# Patient Record
Sex: Male | Born: 1965 | Race: Black or African American | Hispanic: No | Marital: Married | Smoking: Current every day smoker
Health system: Southern US, Community
[De-identification: ages and names within clinical notes are randomized; demographics above are authoritative.]

## PROBLEM LIST (undated history)

## (undated) DIAGNOSIS — C801 Malignant (primary) neoplasm, unspecified: Secondary | ICD-10-CM

## (undated) HISTORY — PX: PORT-A-CATH REMOVAL: SHX5289

## (undated) HISTORY — PX: TESTICLE REMOVAL: SHX68

## (undated) HISTORY — PX: ACHILLES TENDON REPAIR: SUR1153

---

## 1999-12-16 ENCOUNTER — Encounter: Payer: Self-pay | Admitting: Neurosurgery

## 1999-12-16 ENCOUNTER — Ambulatory Visit (HOSPITAL_COMMUNITY): Admission: RE | Admit: 1999-12-16 | Discharge: 1999-12-16 | Payer: Self-pay | Admitting: Neurosurgery

## 1999-12-30 ENCOUNTER — Encounter: Payer: Self-pay | Admitting: Neurosurgery

## 1999-12-30 ENCOUNTER — Ambulatory Visit (HOSPITAL_COMMUNITY): Admission: RE | Admit: 1999-12-30 | Discharge: 1999-12-30 | Payer: Self-pay | Admitting: Neurosurgery

## 2000-01-16 ENCOUNTER — Encounter: Payer: Self-pay | Admitting: Neurosurgery

## 2000-01-16 ENCOUNTER — Ambulatory Visit (HOSPITAL_COMMUNITY): Admission: RE | Admit: 2000-01-16 | Discharge: 2000-01-16 | Payer: Self-pay | Admitting: Neurosurgery

## 2000-07-17 ENCOUNTER — Encounter: Payer: Self-pay | Admitting: Neurosurgery

## 2000-07-17 ENCOUNTER — Ambulatory Visit (HOSPITAL_COMMUNITY): Admission: RE | Admit: 2000-07-17 | Discharge: 2000-07-17 | Payer: Self-pay | Admitting: Neurosurgery

## 2000-07-27 ENCOUNTER — Encounter: Payer: Self-pay | Admitting: Neurosurgery

## 2000-07-31 ENCOUNTER — Encounter (HOSPITAL_COMMUNITY): Admission: RE | Admit: 2000-07-31 | Discharge: 2000-08-30 | Payer: Self-pay | Admitting: Oncology

## 2000-07-31 ENCOUNTER — Encounter: Admission: RE | Admit: 2000-07-31 | Discharge: 2000-07-31 | Payer: Self-pay | Admitting: Oncology

## 2000-08-01 ENCOUNTER — Encounter: Payer: Self-pay | Admitting: Neurosurgery

## 2000-08-01 ENCOUNTER — Inpatient Hospital Stay (HOSPITAL_COMMUNITY): Admission: RE | Admit: 2000-08-01 | Discharge: 2000-08-02 | Payer: Self-pay | Admitting: Neurosurgery

## 2001-05-17 ENCOUNTER — Encounter (HOSPITAL_COMMUNITY): Admission: RE | Admit: 2001-05-17 | Discharge: 2001-06-16 | Payer: Self-pay | Admitting: Oncology

## 2001-11-17 ENCOUNTER — Emergency Department (HOSPITAL_COMMUNITY): Admission: EM | Admit: 2001-11-17 | Discharge: 2001-11-17 | Payer: Self-pay | Admitting: Emergency Medicine

## 2001-12-16 ENCOUNTER — Encounter (HOSPITAL_COMMUNITY): Admission: RE | Admit: 2001-12-16 | Discharge: 2002-01-15 | Payer: Self-pay | Admitting: Oncology

## 2001-12-16 ENCOUNTER — Encounter: Admission: RE | Admit: 2001-12-16 | Discharge: 2001-12-16 | Payer: Self-pay | Admitting: Oncology

## 2001-12-16 ENCOUNTER — Encounter (HOSPITAL_COMMUNITY): Payer: Self-pay | Admitting: Oncology

## 2002-04-10 ENCOUNTER — Emergency Department (HOSPITAL_COMMUNITY): Admission: EM | Admit: 2002-04-10 | Discharge: 2002-04-10 | Payer: Self-pay | Admitting: Emergency Medicine

## 2002-04-10 ENCOUNTER — Encounter: Payer: Self-pay | Admitting: Emergency Medicine

## 2002-06-13 ENCOUNTER — Encounter: Admission: RE | Admit: 2002-06-13 | Discharge: 2002-06-13 | Payer: Self-pay | Admitting: Oncology

## 2002-06-13 ENCOUNTER — Encounter (HOSPITAL_COMMUNITY): Payer: Self-pay | Admitting: Oncology

## 2002-06-13 ENCOUNTER — Encounter (HOSPITAL_COMMUNITY): Admission: RE | Admit: 2002-06-13 | Discharge: 2002-07-13 | Payer: Self-pay | Admitting: Oncology

## 2003-01-23 ENCOUNTER — Encounter: Admission: RE | Admit: 2003-01-23 | Discharge: 2003-01-23 | Payer: Self-pay | Admitting: Oncology

## 2003-01-23 ENCOUNTER — Encounter (HOSPITAL_COMMUNITY): Admission: RE | Admit: 2003-01-23 | Discharge: 2003-02-22 | Payer: Self-pay | Admitting: Oncology

## 2003-08-05 ENCOUNTER — Encounter: Admission: RE | Admit: 2003-08-05 | Discharge: 2003-08-05 | Payer: Self-pay | Admitting: Oncology

## 2003-08-05 ENCOUNTER — Encounter (HOSPITAL_COMMUNITY): Admission: RE | Admit: 2003-08-05 | Discharge: 2003-09-04 | Payer: Self-pay | Admitting: Oncology

## 2004-04-13 ENCOUNTER — Ambulatory Visit: Payer: Self-pay | Admitting: Family Medicine

## 2006-07-01 ENCOUNTER — Emergency Department (HOSPITAL_COMMUNITY): Admission: EM | Admit: 2006-07-01 | Discharge: 2006-07-02 | Payer: Self-pay | Admitting: Emergency Medicine

## 2007-02-21 ENCOUNTER — Encounter: Payer: Self-pay | Admitting: Family Medicine

## 2007-06-12 ENCOUNTER — Emergency Department (HOSPITAL_COMMUNITY): Admission: EM | Admit: 2007-06-12 | Discharge: 2007-06-12 | Payer: Self-pay | Admitting: Emergency Medicine

## 2007-06-13 ENCOUNTER — Ambulatory Visit: Payer: Self-pay | Admitting: Orthopedic Surgery

## 2007-06-13 DIAGNOSIS — M66369 Spontaneous rupture of flexor tendons, unspecified lower leg: Secondary | ICD-10-CM

## 2007-06-14 ENCOUNTER — Ambulatory Visit (HOSPITAL_COMMUNITY): Admission: RE | Admit: 2007-06-14 | Discharge: 2007-06-14 | Payer: Self-pay | Admitting: Orthopedic Surgery

## 2007-06-14 ENCOUNTER — Ambulatory Visit: Payer: Self-pay | Admitting: Orthopedic Surgery

## 2007-06-17 ENCOUNTER — Ambulatory Visit: Payer: Self-pay | Admitting: Orthopedic Surgery

## 2007-06-18 ENCOUNTER — Encounter: Payer: Self-pay | Admitting: Orthopedic Surgery

## 2007-06-24 ENCOUNTER — Ambulatory Visit: Payer: Self-pay | Admitting: Orthopedic Surgery

## 2007-07-09 ENCOUNTER — Ambulatory Visit: Payer: Self-pay | Admitting: Orthopedic Surgery

## 2007-07-31 ENCOUNTER — Ambulatory Visit: Payer: Self-pay | Admitting: Orthopedic Surgery

## 2007-09-19 ENCOUNTER — Telehealth: Payer: Self-pay | Admitting: Orthopedic Surgery

## 2007-10-03 ENCOUNTER — Encounter: Payer: Self-pay | Admitting: Orthopedic Surgery

## 2010-03-22 NOTE — Letter (Signed)
Summary: Historic Patient File  Historic Patient File   Imported By: Lind Guest 01/11/2010 09:12:46  _____________________________________________________________________  External Attachment:    Type:   Image     Comment:   External Document

## 2010-03-22 NOTE — Progress Notes (Signed)
Summary: Fol up call made re: No Show for appt  Phone Note Outgoing Call   Call placed to: Patient Summary of Call: per Dr Romeo Apple, called patient / lft message on ans machine @[facility]  [phone] (2nd contact ph#nonworking) to fol up d/t his no show for appointment Mon, 09/16/07.  [facility] spoke w/patient Fri 09/13/07 & confirmed appt. Initial call taken by: Cammie Sickle,  September 19, 2007 5:23 PM  Follow-up for Phone Call        Left another msg on pt's voice mail to fol up as no return call as of yet *LETTER? Follow-up by: Cammie Sickle,  October 02, 2007 9:52 AM

## 2010-07-05 NOTE — Op Note (Signed)
[name], [name]             ACCOUNT NO.:  [id number]   MEDICAL RECORD NO.:  [id number]          PATIENT TYPE:  AMB   LOCATION:  DAY                           FACILITY:  [facility]   PHYSICIAN:  Vickki Hearing, M.D.DATE OF BIRTH:  December 28, 1965   DATE OF PROCEDURE:  06/14/2007  DATE OF DISCHARGE:                               OPERATIVE REPORT   HISTORY:  This is a 45 year old male with a history of testicular  cancer, treated successfully with right oophorectomy and chemotherapy  who presented with a right Achilles tendon rupture, injured on June 12, 2007.  He simply bent over to pick up a basketball and the tendon  popped.  This happened to him several years back, and he ruptured the  left Achilles, and we repaired it successfully.   PREOPERATIVE DIAGNOSIS:  Achilles tendon rupture, right ankle.   POSTOPERATIVE DIAGNOSIS:  Achilles tendon rupture, right ankle.   PROCEDURE:  Open repair of right Achilles tendon.   SURGEON:  Vickki Hearing, MD.   ASSISTED BY:   [place].   ANESTHESIA:  General anesthetic with the tube.   FINDINGS:  Complete rupture of the Achilles tendon in the characteristic  watershed area.  There were no specimens.  Findings as stated.   BLOOD LOSS:  Minimal.   COMPLICATIONS:  None.   COUNTS:  Correct.   CONDITION:  Patient to PACU in good condition.   DETAILS OF PROCEDURE:  Patrick Zavala was evaluated preoperatively in  the holding area.  Appropriate site marking was performed, and the chart  was updated.   He was given Ancef, taken to surgery where he had general anesthesia  with intubation smoothly and without complication.  He was then placed  prone on the operating table with padding, and his right lower extremity  was prepped and draped using sterile technique.  Tourniquet applied to  the thigh.  After proper time-out procedure, the limb was exsanguinated  with a 4-inch Esmarch, tourniquet elevated 300 mmHg where it stayed for  41 minutes.  Slightly, medial incision was made over the Achilles tendon  and extended proximally and distally from the site of the rupture.  Subcutaneous tissue divided, tendon sheath was split, ruptured tendon  was freshened, debrided, and then #5 Tycron suture was passed in a  baseball stitch fashion from the distal portion proximally, and then  from the proximal portion distally.  The medial and lateral two ends of  each suture were tied with the foot in plantar flexion.  This secured  the tendon repair and reapposed the two edges.  We then irrigated the  wound and closed with 2-0 Monocryl and staples.  We injected 20 mL of  Marcaine with epinephrine around the skin edges, tourniquet was  released, and sterile bandage was applied.  Foot was placed in a  posterior splint with the foot at neutral position.  The patient  extubated and taken to recovery room in stable condition.   POSTOPERATIVE PLAN:  Six weeks nonweightbearing, then 6 weeks  weightbearing and brace.  At 6 weeks postop, the patient can start  strengthening exercises.  From week 0 through 6, the patient can do  plantar flexion exercises and dorsiflexion exercises.  The patient will  be discharged home on Lorcet Plus and Phenergan.      Vickki Hearing, M.D.  Electronically Signed     SEH/MEDQ  D:  06/14/2007  T:  06/15/2007  Job:  [id number]

## 2010-07-05 NOTE — H&P (Signed)
NAMELAQUINCY, EASTRIDGE             ACCOUNT NO.:  [id number]   MEDICAL RECORD NO.:  [id number]          PATIENT TYPE:  AMB   LOCATION:  DAY                           FACILITY:  [facility]   PHYSICIAN:  Vickki Hearing, M.D.DATE OF BIRTH:  12/29/1965   DATE OF ADMISSION:  DATE OF DISCHARGE:  LH                              HISTORY & PHYSICAL   CHIEF COMPLAINT:  Torn right Achilles tendon.   HISTORY:  A 45 year old male with a history of testicular cancer,  treated successfully with right oophorectomy and chemotherapy, presents  status post left Achilles tendon repair now with a right Achilles tendon  repair.  He was injured on May 23, 2007.  He simply bent over to pick  up a ball and the tendon popped.  He heard a loud sound, pain, burning  in his right Achilles.  This was similar to his other injury for which  he underwent repair successfully.  He presents for surgical treatment of  the right Achilles tear.   He has no other medical problems.   No family history, except for a family history of cancer.   He is married.  He does smoke, drinks occasionally.  He has a high  school diploma.   REVIEW OF SYSTEMS:  Only positive for testicular cancer.   He has also had lumbar disk surgery.  He had the Achilles surgery as  stated.  He had an oophorectomy as stated.   PHYSICAL EXAMINATION:  VITAL SIGNS:  Stable.  Weight is 161 pounds.  APPEARANCE:  Normal.  CARDIOVASCULAR:  Intact.  SKIN:  Warm and dry with no rashes.  LYMPH NODES:  Normal.  PSYCH:  Normal.  He is awake, alert, and oriented x3.  NEUROLOGIC:  Normal.  MUSCULOSKELETAL:  Findings were a positive Thompson test for rupture of  the right Achilles tendon.  There is swelling there with palpable gap  and tenderness.  Passive range of motion is painful as well.  His other  extremities on inspection are normal.  There are no contractures.  No  instability.  Normal muscle tone is noted.   IMPRESSION:  Right Achilles tendon  rupture.   PLAN:  Right Achilles tendon repair.      Vickki Hearing, M.D.  Electronically Signed     SEH/MEDQ  D:  06/13/2007  T:  06/13/2007  Job:  [id number]   cc:   [facility]  Fax: [phone]

## 2010-07-08 NOTE — Op Note (Signed)
. [facility]  Patient:    Patrick Zavala, Patrick Zavala                    MRN: [id number] Proc. Date: 08/01/00 Adm. 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  PROCEDURE:  Lumbar laminectomy and microdiskectomy at L4-5 using microdissection of the right L5 nerve root and microscopic diskectomy.  SURGEON:  Donzetta Sprung. Roney Jaffe., MD  ASSISTANT:  Reinaldo Meeker, M.D.  ANESTHESIA:  General endotracheal anesthesia.  ESTIMATED BLOOD LOSS:  Less than 100 cc.  INDICATIONS:  The patient is a very pleasant 45 year old gentleman who has had longstanding back and right leg pain radiating down to the top of his foot and his big toe for the last year or so.  He has failed anti-inflammatories, physical therapy, epidural steroid injections, and has had progressive worsening of his pain.  The patients preoperative imaging showed a ruptured disk compressing his right L5 nerve root, counseled on the risks, benefits of surgery.  The patient understands these.  DESCRIPTION OF PROCEDURE:  The patient was brought to the OR, induced under general anesthesia, positioned prone on the Wilson frame.  His back was prepped and draped in the usual sterile fashion.  After infiltration with 10 cc of lidocaine with epinephrine, a midline incision was made with a 10 blade scalpel after localization with an x-ray confirming L4-5 interspace.  Bovie electrocautery was done.  Subperiosteal dissection was carried out along the lamina of L4 and L5 on the right side.  Intraoperative x-ray confirmed localization of the L4-5 interspace.  Then using a 3 and 4 mm Kerrison punch, the inferior aspect of the L4 lamina was removed, and the medial aspect of this complex using highspeed drill, part of the medial aspect of the  facet complex was also thinned out.  The remainder of the laminotomy was continued with a 3 mm Kerrison punch.  Thecal sac was visualized.  Ligamentum flavum was removed in piecemeal fashion exposing the proximal aspect of the L5 nerve root. The operating microscope was draped, brought into the field, and under microscopic illumination, the remainder of the L5 nerve root was dissected free.  Then a 4 punch was used to palpate the interspace and reflect the thecal sac medially.  There was noted to be a bulbous piece of disk laterally compressing the proximal aspect of the L5 nerve root.  The DEricko nerve root resector was used to hold the L5 nerve root medially.  The annulotomy was made with 11 blade scalpel and a lot of degenerated disk material was removed. There was a bulbous fragment both centrally and laterally that was removed and the pituitary rongeurs used to clean out the remainder of the interspace. After the interspace had been completely cleaned out, the nerve was explored with a hockey stick and noted to be completely decompressed.  The wound was copiously irrigated and hemostasis was maintained.  Gelfoam was overlaid on top of the dura.  The fascia was closed with 0 interrupted Vicryl, subcutaneous tissue was closed with 2-0 interrupted Vicryl, and the skin was closed with running 4-0 subcuticular.  Benzoin and Steri-Strips were applied. At the end of the case, all sponge,  needle, and instrument counts were correct. DD:  08/01/00 TD:  08/01/00 Job: [id number]

## 2010-08-27 ENCOUNTER — Emergency Department (HOSPITAL_COMMUNITY)
Admission: EM | Admit: 2010-08-27 | Discharge: 2010-08-27 | Disposition: A | Discharge status: Home / Self Care | Payer: Self-pay | Attending: Emergency Medicine | Admitting: Emergency Medicine

## 2010-08-27 ENCOUNTER — Encounter: Payer: Self-pay | Admitting: Emergency Medicine

## 2010-08-27 DIAGNOSIS — K0889 Other specified disorders of teeth and supporting structures: Secondary | ICD-10-CM

## 2010-08-27 DIAGNOSIS — F17200 Nicotine dependence, unspecified, uncomplicated: Secondary | ICD-10-CM | POA: Insufficient documentation

## 2010-08-27 DIAGNOSIS — K089 Disorder of teeth and supporting structures, unspecified: Secondary | ICD-10-CM | POA: Insufficient documentation

## 2010-08-27 HISTORY — DX: Malignant (primary) neoplasm, unspecified: C80.1

## 2010-08-27 MED ORDER — HYDROMORPHONE HCL 2 MG PO TABS: 2.0000 mg | ORAL_TABLET | ORAL | Status: AC | PRN

## 2010-08-27 MED ORDER — IBUPROFEN 800 MG PO TABS: 800.0000 mg | ORAL_TABLET | Freq: Three times a day (TID) | ORAL | Status: AC

## 2010-08-27 MED ORDER — PENICILLIN V POTASSIUM 500 MG PO TABS: 500.0000 mg | ORAL_TABLET | Freq: Four times a day (QID) | ORAL | Status: AC

## 2010-08-27 MED ORDER — IBUPROFEN 800 MG PO TABS
800.0000 mg | ORAL_TABLET | Freq: Once | ORAL | Status: AC
Start: 2010-08-27 — End: 2010-08-27
  Administered 2010-08-27: 800 mg via ORAL
  Filled 2010-08-27: qty 1

## 2010-08-27 NOTE — ED Notes (Signed)
Right sided tooth ache x 2 days with increased swelling in right lower jaw.  Taking ibuprofen at home with no relief.  Unable to open mouth completely.  Minimal swelling noted to right lower jaw.  nad noted.

## 2010-08-27 NOTE — ED Provider Notes (Signed)
History     Chief Complaint  Patient presents with   Dental Pain    Pt c/o chipping tooth to r lower side.slight swelling noticed. happened yesterday. NAD   HPI Comments: Pt reports acute toothache onset last night s/p chipped tooth; has dentist appt on Monday but pain worse today.   Patient is a 45 y.o. male presenting with tooth pain. The history is provided by the patient.  Dental PainThe primary symptoms include mouth pain. Primary symptoms do not include dental injury, headaches, fever, shortness of breath, sore throat or cough. Primary symptoms comment: right last lower molar pain with swelling to right jaw The symptoms began 12 to 24 hours ago. The symptoms are worsening. The symptoms occur constantly.  Additional symptoms include: facial swelling. Additional symptoms do not include: trouble swallowing and ear pain.    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 No pertinent family history.  History  Substance Use Topics   Smoking status: Current Everyday Smoker -- 0.2 packs/day    Types: Cigarettes   Smokeless tobacco: Not on file   Alcohol Use: Yes     occasional      Review of Systems  Constitutional: Negative for fever and chills.  HENT: Positive for facial swelling and dental problem. Negative for ear pain, sore throat, trouble swallowing and neck pain.   Eyes: Negative for pain.  Respiratory: Negative for cough and shortness of breath.   Cardiovascular: Negative for chest pain.  Gastrointestinal: Negative for nausea, vomiting and abdominal pain.  Musculoskeletal: Negative for back pain.  Skin: Negative for rash.  Neurological: Negative for headaches.    Physical Exam  BP 145/104   Pulse 95   Temp(Src) 98.7 F (37.1 C) (Oral)   Resp 0   Ht 5\' 10"  (1.778 m)   Wt 155 lb (70.308 kg)   BMI  22.24 kg/m2   SpO2 99%  Physical Exam  Nursing note and vitals reviewed. Constitutional: He is oriented to person, place, and time. He appears well-developed and well-nourished.  HENT:  Head: Normocephalic and atraumatic.  Right Ear: External ear normal.  Left Ear: External ear normal.  Mouth/Throat: Oropharynx is clear and moist.       Right lower last molar erosion to the lingual aspect with tenderness; mild facial swelling;  Eyes: Conjunctivae and EOM are normal.  Neck: Normal range of motion. Neck supple.  Cardiovascular: Normal rate, regular rhythm and normal heart sounds.   Pulmonary/Chest: Effort normal and breath sounds normal. He has no wheezes. He exhibits no tenderness.  Abdominal: Soft. Bowel sounds are normal. There is no tenderness.  Musculoskeletal: Normal range of motion. He exhibits no edema and no tenderness.  Neurological: He is alert and oriented to person, place, and time. He has normal strength. No cranial nerve deficit or sensory deficit.  Skin: Skin is warm and dry. No rash noted.  Psychiatric: He has a normal mood and affect.    ED Course  Procedures Written by Enos Fling acting as scribe for Dr. Deretha Emory.  MDM

## 2010-11-15 LAB — HEMOGLOBIN AND HEMATOCRIT, BLOOD: HCT: 40.9

## 2013-07-16 ENCOUNTER — Emergency Department (HOSPITAL_COMMUNITY): Payer: BC Managed Care – PPO

## 2013-07-16 ENCOUNTER — Emergency Department (HOSPITAL_COMMUNITY)
Admission: EM | Admit: 2013-07-16 | Discharge: 2013-07-16 | Disposition: A | Discharge status: Home / Self Care | Payer: BC Managed Care – PPO | Attending: Emergency Medicine | Admitting: Emergency Medicine

## 2013-07-16 ENCOUNTER — Encounter (HOSPITAL_COMMUNITY): Payer: Self-pay | Admitting: Emergency Medicine

## 2013-07-16 DIAGNOSIS — Z8547 Personal history of malignant neoplasm of testis: Secondary | ICD-10-CM | POA: Insufficient documentation

## 2013-07-16 DIAGNOSIS — IMO0002 Reserved for concepts with insufficient information to code with codable children: Secondary | ICD-10-CM | POA: Insufficient documentation

## 2013-07-16 DIAGNOSIS — F17200 Nicotine dependence, unspecified, uncomplicated: Secondary | ICD-10-CM | POA: Insufficient documentation

## 2013-07-16 DIAGNOSIS — S86912A Strain of unspecified muscle(s) and tendon(s) at lower leg level, left leg, initial encounter: Secondary | ICD-10-CM

## 2013-07-16 DIAGNOSIS — X503XXA Overexertion from repetitive movements, initial encounter: Secondary | ICD-10-CM | POA: Insufficient documentation

## 2013-07-16 DIAGNOSIS — Y9389 Activity, other specified: Secondary | ICD-10-CM | POA: Insufficient documentation

## 2013-07-16 DIAGNOSIS — Y929 Unspecified place or not applicable: Secondary | ICD-10-CM | POA: Insufficient documentation

## 2013-07-16 DIAGNOSIS — X500XXA Overexertion from strenuous movement or load, initial encounter: Secondary | ICD-10-CM | POA: Insufficient documentation

## 2013-07-16 MED ORDER — TRAMADOL HCL 50 MG PO TABS: ORAL_TABLET | ORAL | Status: DC

## 2013-07-16 MED ORDER — KETOROLAC TROMETHAMINE 10 MG PO TABS
10.0000 mg | ORAL_TABLET | Freq: Once | ORAL | Status: AC
  Administered 2013-07-16: 10 mg via ORAL
  Filled 2013-07-16: qty 1

## 2013-07-16 MED ORDER — DICLOFENAC SODIUM 75 MG PO TBEC: 75.0000 mg | DELAYED_RELEASE_TABLET | Freq: Two times a day (BID) | ORAL | Status: DC

## 2013-07-16 NOTE — ED Provider Notes (Signed)
CSN: [id number]     Arrival date & time 07/16/13  7341 History   First MD Initiated Contact with Patient 07/16/13 1128     Chief Complaint  Patient presents with  . Knee Pain     (Consider location/radiation/quality/duration/timing/severity/associated sxs/prior Treatment) HPI Comments: Patient states he's had some problem with his left knee for some time. He was working on last evening, doing a lot of pulling and pushing at his job, and later notice pain in his knee at about 4:30 this morning. The patient states that the pain got progressively worse instead of better, and he could not find any way to relieve the pain. He became concerned for possible tear or other problem in his knee as he has had anterior cruciate ligament surgery in the past and he presents now for assistance.  Patient is a 48 y.o. male presenting with knee pain. The history is provided by the patient.  Knee Pain Associated symptoms: no back pain and no neck pain     Past Medical History  Diagnosis Date  . Cancer     testicular   Past Surgical History  Procedure Laterality Date  . Achilles tendon repair    . Port-a-cath removal    . Testicle removal      due to cancer, Right   No family history on file. History  Substance Use Topics  . Smoking status: Current Every Day Smoker -- 0.20 packs/day    Types: Cigarettes  . Smokeless tobacco: Not on file  . Alcohol Use: Yes     Comment: occasional    Review of Systems  Constitutional: Negative for activity change.       All ROS Neg except as noted in HPI  HENT: Negative for nosebleeds.   Eyes: Negative for photophobia and discharge.  Respiratory: Negative for cough, shortness of breath and wheezing.   Cardiovascular: Negative for chest pain and palpitations.  Gastrointestinal: Negative for abdominal pain and blood in stool.  Genitourinary: Negative for dysuria, frequency and hematuria.  Musculoskeletal: Positive for arthralgias. Negative for back pain and  neck pain.  Skin: Negative.   Neurological: Negative for dizziness, seizures and speech difficulty.  Psychiatric/Behavioral: Negative for hallucinations and confusion.      Allergies  Oxycodone-acetaminophen  Home Medications   Prior to Admission medications   Not on File   BP 115/78  Pulse 68  Temp(Src) 98.2 F (36.8 C) (Oral)  Resp 20  Ht 5\' 10"  (1.778 m)  Wt 155 lb (70.308 kg)  BMI 22.24 kg/m2  SpO2 98% Physical Exam  Nursing note and vitals reviewed. Constitutional: He is oriented to person, place, and time. He appears well-developed and well-nourished.  Non-toxic appearance.  HENT:  Head: Normocephalic.  Right Ear: Tympanic membrane and external ear normal.  Left Ear: Tympanic membrane and external ear normal.  Eyes: EOM and lids are normal. Pupils are equal, round, and reactive to light.  Neck: Normal range of motion. Neck supple. Carotid bruit is not present.  Cardiovascular: Normal rate, regular rhythm, normal heart sounds, intact distal pulses and normal pulses.   Pulmonary/Chest: Breath sounds normal. No respiratory distress.  Abdominal: Soft. Bowel sounds are normal. There is no tenderness. There is no guarding.  Musculoskeletal: Normal range of motion.  There is good range of motion of the left hip. There is crepitus and pain with range of motion of the left knee. The patella is in the midline. There is no joint effusion present. No posterior mass noted. There is  no deformity of the quadriceps area or the calf behind the knee. The dorsalis pedis pulses 2+. The Achilles tendon is intact.  Lymphadenopathy:       Head (right side): No submandibular adenopathy present.       Head (left side): No submandibular adenopathy present.    He has no cervical adenopathy.  Neurological: He is alert and oriented to person, place, and time. He has normal strength. No cranial nerve deficit or sensory deficit.  Skin: Skin is warm and dry.  Psychiatric: He has a normal mood and  affect. His speech is normal.    ED Course  Procedures (including critical care time) Labs Review Labs Reviewed - No data to display  Imaging Review Dg Knee Complete 4 Views Left  07/16/2013   CLINICAL DATA:  Lateral left knee pain  EXAM: LEFT KNEE - COMPLETE 4+ VIEW  COMPARISON:  None.  FINDINGS: There is no evidence of fracture, dislocation, or joint effusion. There is no evidence of arthropathy or other focal bone abnormality. Soft tissues are unremarkable.  IMPRESSION: Negative.   Electronically Signed   By: Lajean Manes M.D.   On: 07/16/2013 11:07     EKG Interpretation None      MDM X-ray of the left knee reveals some degenerative changes present, but no fracture, dislocation, or effusion. Suspect the patient has a strain of the left knee as well as some exacerbation of the degenerative changes.  Plan at this time is for the patient to receive a knee immobilizer. Crutches. Prescription for Toradol and diclofenac. Patient referred to orthopedics.    Final diagnoses:  None    *I have reviewed nursing notes, vital signs, and all appropriate lab and imaging results for this patient.Lenox Ahr, PA-C 07/16/13 1225

## 2013-07-16 NOTE — ED Notes (Addendum)
Pt reports pain in left lateral knee since 0430 this morning.  Denies injury that he is aware of.  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

## 2013-07-16 NOTE — Discharge Instructions (Signed)
Your x-ray is negative for fracture or dislocation. Please use the knee immobilizer for the next 7 days or until seen by the orthopedic MD. Use crutches until you can safely apply weight to the left leg. Please see Dr Luna Glasgow as soon as possible. Please use diclofenac and tramadol for discomfort. Knee Sprain A knee sprain is a tear in the strong bands of tissue that connect the bones (ligaments) of your knee. HOME CARE  Raise (elevate) your injured knee to lessen puffiness (swelling).  To ease pain and puffiness, put ice on the injured area.  Put ice in a plastic bag.  Place a towel between your skin and the bag.  Leave the ice on for 20 minutes, 2 3 times a day.  Only take medicine as told by your doctor.  Do not leave your knee unprotected until pain and stiffness go away (usually 4 6 weeks).  If you have a cast or splint, do not get it wet. If your doctor told you to not take it off, cover it with a plastic bag when you shower or bathe. Do not swim.  Your doctor may have you do exercises to prevent or limit permanent weakness and stiffness. GET HELP RIGHT AWAY IF:   Your cast or splint becomes damaged.  Your pain gets worse.  You have a lot of pain, puffiness, or numbness below the cast or splint. MAKE SURE YOU:   Understand these instructions.  Will watch your condition.  Will get help right away if you are not doing well or get worse. Document Released: 01/25/2009 Document Revised: 11/27/2012 Document Reviewed: 10/15/2012 [facility] Patient Information 2014 [place].

## 2013-07-16 NOTE — ED Provider Notes (Signed)
Medical screening examination/treatment/procedure(s) were performed by non-physician practitioner and as supervising physician I was immediately available for consultation/collaboration.   EKG Interpretation None        Kimbree Casanas L Daly Whipkey, MD 07/16/13 1521 

## 2013-12-11 ENCOUNTER — Encounter (HOSPITAL_COMMUNITY): Payer: Self-pay | Admitting: Emergency Medicine

## 2013-12-11 ENCOUNTER — Emergency Department (HOSPITAL_COMMUNITY)
Admission: EM | Admit: 2013-12-11 | Discharge: 2013-12-11 | Disposition: A | Discharge status: Home / Self Care | Payer: BC Managed Care – PPO | Attending: Emergency Medicine | Admitting: Emergency Medicine

## 2013-12-11 ENCOUNTER — Other Ambulatory Visit: Payer: Self-pay

## 2013-12-11 DIAGNOSIS — Z8547 Personal history of malignant neoplasm of testis: Secondary | ICD-10-CM | POA: Insufficient documentation

## 2013-12-11 DIAGNOSIS — Z720 Tobacco use: Secondary | ICD-10-CM | POA: Diagnosis not present

## 2013-12-11 DIAGNOSIS — F1410 Cocaine abuse, uncomplicated: Secondary | ICD-10-CM | POA: Insufficient documentation

## 2013-12-11 DIAGNOSIS — F1010 Alcohol abuse, uncomplicated: Secondary | ICD-10-CM

## 2013-12-11 LAB — CBC
HCT: 44.6 % (ref 39.0–52.0)
Hemoglobin: 16.4 g/dL (ref 13.0–17.0)
MCH: 28.6 pg (ref 26.0–34.0)
MCHC: 36.8 g/dL — ABNORMAL HIGH (ref 30.0–36.0)
MCV: 77.8 fL — ABNORMAL LOW (ref 78.0–100.0)
Platelets: 248 K/uL (ref 150–400)
RBC: 5.73 MIL/uL (ref 4.22–5.81)
RDW: 14.4 % (ref 11.5–15.5)
WBC: 7.6 K/uL (ref 4.0–10.5)

## 2013-12-11 LAB — COMPREHENSIVE METABOLIC PANEL WITH GFR
ALT: 23 U/L (ref 0–53)
AST: 28 U/L (ref 0–37)
Albumin: 4.3 g/dL (ref 3.5–5.2)
Alkaline Phosphatase: 79 U/L (ref 39–117)
Anion gap: 13 (ref 5–15)
BUN: 9 mg/dL (ref 6–23)
CO2: 25 meq/L (ref 19–32)
Calcium: 10 mg/dL (ref 8.4–10.5)
Chloride: 104 meq/L (ref 96–112)
Creatinine, Ser: 1.27 mg/dL (ref 0.50–1.35)
GFR calc Af Amer: 77 mL/min — ABNORMAL LOW
GFR calc non Af Amer: 67 mL/min — ABNORMAL LOW
Glucose, Bld: 113 mg/dL — ABNORMAL HIGH (ref 70–99)
Potassium: 4.6 meq/L (ref 3.7–5.3)
Sodium: 142 meq/L (ref 137–147)
Total Bilirubin: 0.7 mg/dL (ref 0.3–1.2)
Total Protein: 7.9 g/dL (ref 6.0–8.3)

## 2013-12-11 LAB — ACETAMINOPHEN LEVEL: Acetaminophen (Tylenol), Serum: <15 ug/mL (ref 10–30)

## 2013-12-11 LAB — ETHANOL: Alcohol, Ethyl (B): <11 mg/dL (ref 0–11)

## 2013-12-11 LAB — RAPID URINE DRUG SCREEN, HOSP PERFORMED
Amphetamines: NOT DETECTED
Barbiturates: NOT DETECTED
Benzodiazepines: NOT DETECTED
Cocaine: POSITIVE — AB
Opiates: NOT DETECTED
Tetrahydrocannabinol: NOT DETECTED

## 2013-12-11 LAB — SALICYLATE LEVEL: Salicylate Lvl: <2 mg/dL — ABNORMAL LOW (ref 2.8–20.0)

## 2013-12-11 MED ORDER — LORAZEPAM 1 MG PO TABS: 0.0000 mg | ORAL_TABLET | Freq: Two times a day (BID) | ORAL | Status: DC

## 2013-12-11 MED ORDER — LORAZEPAM 1 MG PO TABS: 0.0000 mg | ORAL_TABLET | Freq: Four times a day (QID) | ORAL | Status: DC

## 2013-12-11 NOTE — Progress Notes (Signed)
Patient denies complaint at present. States that he is focused on getting help at this time. Reports no feelings of anxiety or depression at present.  Encouragement offered.   Q 15 safety checks continue on the unit.

## 2013-12-11 NOTE — ED Notes (Signed)
Belongings bag x 1 with jeans, shirt, belt, socks and tennis shoes.

## 2013-12-11 NOTE — ED Provider Notes (Signed)
CSN: [id number]     Arrival date & time 12/11/13  1434 History   First MD Initiated Contact with Patient 12/11/13 1656     Chief Complaint  Patient presents with  . Alcohol Problem   (Consider location/radiation/quality/duration/timing/severity/associated sxs/prior Treatment) HPI Patrick Zavala is a 48 yo male requesting detox from ETOH and cocaine.  He reports going on binges every few months, usually initiated by drinking a case of beer a day followed "lots" of cocaine.  This most recent binge lasted 3 days.  He last used cocaine and ETOH yesterday.  He states his motivation for wanting detox is he has just had enough and he wants to be clean for his wife and daughter. He denies any pain, shortness of breath, or SI/HI.  Past Medical History  Diagnosis Date  . Cancer     testicular   Past Surgical History  Procedure Laterality Date  . Achilles tendon repair    . Port-a-cath removal    . 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 No pertinent family history. History  Substance Use Topics  . Smoking status: Current Every Day Smoker -- 0.20 packs/day    Types: Cigarettes  . Smokeless tobacco: Not on file  . Alcohol Use: Yes     Comment: occasional    Review of Systems  Constitutional: Negative for fever and chills.  HENT: Negative for sore throat.   Eyes: Negative for visual disturbance.  Respiratory: Negative for cough and shortness of breath.   Cardiovascular: Negative for chest pain and leg swelling.  Gastrointestinal: Negative for nausea, vomiting and diarrhea.  Genitourinary: Negative for dysuria.  Musculoskeletal: Negative for myalgias.  Skin: Negative for rash.  Neurological: Negative for weakness, numbness and headaches.  Psychiatric/Behavioral: Negative for suicidal ideas.       ETOH/Cocaine binging    Allergies  Oxycodone-acetaminophen  Home Medications   Prior to Admission medications   Not on File   BP 151/105  Pulse 81   Temp(Src) 98.4 F (36.9 C) (Oral)  Resp 16  SpO2 100% Physical Exam  Nursing note and vitals reviewed. Constitutional: He is oriented to person, place, and time. He appears well-developed and well-nourished. No distress.  HENT:  Head: Normocephalic and atraumatic.  Mouth/Throat: Oropharynx is clear and moist. No oropharyngeal exudate.  Eyes: Conjunctivae are normal.  Neck: Normal range of motion. Neck supple. No thyromegaly present.  Cardiovascular: Normal rate, regular rhythm and intact distal pulses.  Exam reveals no gallop and no friction rub.   No murmur heard. Pulmonary/Chest: Effort normal and breath sounds normal. No respiratory distress. He has no wheezes. He has no rales. He exhibits no tenderness.  Abdominal: Soft. There is no tenderness.  Musculoskeletal: He exhibits no tenderness.  Lymphadenopathy:    He has no cervical adenopathy.  Neurological: He is alert and oriented to person, place, and time.  Skin: Skin is warm and dry. No rash noted. He is not diaphoretic.  Psychiatric: He has a normal mood and affect.    ED Course  Procedures (including critical care time) Labs Review Labs Reviewed  CBC - Abnormal; Notable for the following:    MCV 77.8 (*)    MCHC 36.8 (*)    All other components within normal limits  COMPREHENSIVE METABOLIC PANEL - Abnormal; Notable for the following:    Glucose, Bld 113 (*)    GFR calc non Af Amer 67 (*)    GFR calc Af Amer 77 (*)  All other components within normal limits  SALICYLATE LEVEL - Abnormal; Notable for the following:    Salicylate Lvl <6.4 (*)    All other components within normal limits  URINE RAPID DRUG SCREEN (HOSP PERFORMED) - Abnormal; Notable for the following:    Cocaine POSITIVE (*)    All other components within normal limits  ACETAMINOPHEN LEVEL  ETHANOL   Imaging Review No results found.   EKG Interpretation   Normal sinus rhythm Normal ECG Vent. rate 60 BPM PR interval 146 ms QRS duration 90  ms QT/QTc 408/408 ms P-R-T axes -2 72 30  MDM   Final diagnoses:  ETOH abuse  Cocaine abuse   48 yo male recently binged on cocaine and ETOH.  Has recurrent binges every few months. Patient has been medically cleared in the ED and is awaiting consult by ACT team for possible placement vs OP clinic information. Pt is currently not having SI or HI and appears stable in NAD. Pt is cleared to be moved back to [facility].   Filed Vitals:   12/11/13 1452 12/11/13 1744  BP: 151/105 120/100  Pulse: 81 69  Temp: 98.4 F (36.9 C) 98.5 F (36.9 C)  TempSrc: Oral Oral  Resp: 16 17  SpO2: 100% 99%   Meds given in ED:  Medications - No data to display  New Prescriptions   No medications on file       Britt Bottom, NP 12/15/13 2143

## 2013-12-11 NOTE — ED Notes (Signed)
Patient changed into wine colored scrubs and wanded by security 

## 2013-12-11 NOTE — BH Assessment (Signed)
Tele Assessment Note   Patrick Zavala is a 48 y.o. male who voluntarily presents to [facility] for alcohol and cocaine detox.  Pt denies SI/HI/AVH.  Pt reports the following: pt says he "binges" on alcohol, stating that he drinks 1-1.5 cases when he is a binge.  His last drink was 12/10/13.  Pt also binges on cocaine and uses $500-$1000.  Pt.'s last use was 12/10/13.  Pt denies stressors, seizures/blackouts, legal issues.  Pt has no w/d sxs at this time.  Pt says his last detox program was 20 yrs ago at [facility].  This Probation officer provided referrals as pt is requesting a long term program to help him with SA.  Pt d/c'd home.    Axis I: Alcohol Abuse and Cocaine Abuse  Axis II: Deferred Axis III:  Past Medical History  Diagnosis Date  . Cancer     testicular   Axis IV: other psychosocial or environmental problems, problems related to social environment and problems with primary support group Axis V: 51-60 moderate symptoms  Past Medical History:  Past Medical History  Diagnosis Date  . Cancer     testicular    Past Surgical History  Procedure Laterality Date  . Achilles tendon repair    . Port-a-cath removal    . 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 No pertinent family history.  Social History:  reports that he has been smoking Cigarettes.  He has been smoking about 0.20 packs per day. He does not have any smokeless tobacco history on file. He reports that he drinks alcohol. He reports that he uses illicit drugs (Cocaine).  Additional Social History:  Alcohol / Drug Use Pain Medications: None  Prescriptions: None  Over the Counter: None  History of alcohol / drug use?: Yes Longest period of sobriety (when/how long): None  Negative Consequences of Use: Work / School;Personal relationships;Financial Withdrawal Symptoms: Other (Comment) (None ) Substance #1 Name of Substance 1: Alcohol  1 - Age of First Use: Teens  1 - Amount  (size/oz): 1-1.5 Cases  1 - Frequency: Binge  1 - Duration: On-going  1 - Last Use / Amount: 12/10/13 Substance #2 Name of Substance 2: Cocaine  2 - Age of First Use: 20's  2 - Amount (size/oz): $500-$1000 2 - Frequency: Binge  2 - Duration: On-going  2 - Last Use / Amount: 12/10/13  CIWA: CIWA-Ar BP: 137/79 mmHg Pulse Rate: 78 Nausea and Vomiting: no nausea and no vomiting Tactile Disturbances: none Tremor: no tremor Auditory Disturbances: not present Paroxysmal Sweats: no sweat visible Visual Disturbances: not present Anxiety: no anxiety, at ease Headache, Fullness in Head: none present Agitation: normal activity Orientation and Clouding of Sensorium: oriented and can do serial additions CIWA-Ar Total: 0 COWS:    PATIENT STRENGTHS: (choose at least two) Communication skills Supportive family/friends Work skills  Allergies:  Allergies  Allergen Reactions  . Oxycodone-Acetaminophen     Percocet    Home Medications:  (Not in a hospital admission)  OB/GYN Status:  No LMP for male patient.  General Assessment Data Location of Assessment: [facility] ED Is this a Tele or Face-to-Face Assessment?: Face-to-Face Is this an Initial Assessment or a Re-assessment for this encounter?: Initial Assessment Living Arrangements: Children;Spouse/significant other (Lives with spouse and son ) Can pt return to current living arrangement?: Yes Admission Status: Voluntary Is patient capable of signing voluntary admission?: Yes Transfer from: Home Referral Source: Self/Family/Friend  Medical  Screening Exam ([place]) Medical Exam completed: No Reason for MSE not completed: Other: (None )  [facility] Crisis Care Plan Living Arrangements: Children;Spouse/significant other (Lives with spouse and son ) Name of Psychiatrist: None  Name of Therapist: None   Education Status Is patient currently in school?: No Current Grade: None  Highest grade of school patient has completed: None   Name of school: None  Contact person: None   Risk to self with the past 6 months Suicidal Ideation: No Suicidal Intent: No Is patient at risk for suicide?: No Suicidal Plan?: No Access to Means: No What has been your use of drugs/alcohol within the last 12 months?: Abusing: cocaine. alcohol  Previous Attempts/Gestures: No How many times?: 0 Other Self Harm Risks: None  Triggers for Past Attempts: None known Intentional Self Injurious Behavior: None Family Suicide History: No Recent stressful life event(s): Other (Comment) (Chronic SA ) Persecutory voices/beliefs?: No Depression: Yes Depression Symptoms: Loss of interest in usual pleasures Substance abuse history and/or treatment for substance abuse?: Yes Suicide prevention information given to non-admitted patients: Not applicable  Risk to Others within the past 6 months Homicidal Ideation: No Thoughts of Harm to Others: No Current Homicidal Intent: No Current Homicidal Plan: No Access to Homicidal Means: No Identified Victim: None  History of harm to others?: No Assessment of Violence: None Noted Violent Behavior Description: None  Does patient have access to weapons?: No Criminal Charges Pending?: No Does patient have a court date: No  Psychosis Hallucinations: None noted Delusions: None noted  Mental Status Report Appear/Hygiene: In scrubs Eye Contact: Good Motor Activity: Unremarkable Speech: Logical/coherent;Soft Level of Consciousness: Alert Mood: Other (Comment) (Appropriate ) Affect: Appropriate to circumstance Anxiety Level: None Thought Processes: Coherent;Relevant Judgement: Unimpaired Orientation: Person;Place;Time;Situation Obsessive Compulsive Thoughts/Behaviors: None  Cognitive Functioning Concentration: Normal Memory: Recent Intact;Remote Intact IQ: Average Insight: Good Impulse Control: Good Appetite: Good Weight Loss: 0 Weight Gain: 0 Sleep: Decreased Total Hours of Sleep:  5 Vegetative Symptoms: None  ADLScreening [facility] Assessment Services) Patient's cognitive ability adequate to safely complete daily activities?: Yes Patient able to express need for assistance with ADLs?: Yes Independently performs ADLs?: Yes (appropriate for developmental age)  Prior Inpatient Therapy Prior Inpatient Therapy: Yes Prior Therapy Dates: 20 yrs ago  Prior Therapy Facilty/Provider(s): [facility]  Reason for Treatment: Detox/Rehab   Prior Outpatient Therapy Prior Outpatient Therapy: No Prior Therapy Dates: None  Prior Therapy Facilty/Provider(s): None  Reason for Treatment: None   ADL Screening (condition at time of admission) Patient's cognitive ability adequate to safely complete daily activities?: Yes Is the patient deaf or have difficulty hearing?: No Does the patient have difficulty seeing, even when wearing glasses/contacts?: No Does the patient have difficulty concentrating, remembering, or making decisions?: No Patient able to express need for assistance with ADLs?: Yes Does the patient have difficulty dressing or bathing?: No Independently performs ADLs?: Yes (appropriate for developmental age) Does the patient have difficulty walking or climbing stairs?: No Weakness of Legs: None Weakness of Arms/Hands: None  Home Assistive Devices/Equipment Home Assistive Devices/Equipment: None  Therapy Consults (therapy consults require a physician order) PT Evaluation Needed: No OT Evalulation Needed: No SLP Evaluation Needed: No Abuse/Neglect Assessment (Assessment to be complete while patient is alone) Physical Abuse: Denies Verbal Abuse: Denies Sexual Abuse: Denies Exploitation of patient/patient's resources: Denies Self-Neglect: Denies Values / Beliefs Cultural Requests During Hospitalization: None Spiritual Requests During Hospitalization: None Consults Spiritual Care Consult Needed: No Social Work Consult Needed: No Regulatory affairs officer (For  Healthcare) Does patient have an advance directive?: No Would patient like information on creating an advanced directive?: No - patient declined information Nutrition Screen- [place] [facility] Patient's home diet: Regular  Additional Information 1:1 In Past 12 Months?: No CIRT Risk: No Elopement Risk: No Does patient have medical clearance?: Yes     Disposition:  Disposition Initial Assessment Completed for this Encounter: Yes Disposition of Patient: Outpatient treatment (Outpatient referrals provided; pt d/c'd) Type of outpatient treatment: Adult (Outpatient referrals provided; pt d/c'd home )  Girtha Rm 12/11/2013 11:05 PM

## 2013-12-11 NOTE — Discharge Instructions (Signed)
If you were given medicines take as directed.  If you are on coumadin or contraceptives realize their levels and effectiveness is altered by many different medicines.  If you have any reaction (rash, tongues swelling, other) to the medicines stop taking and see a physician.   Please follow up as directed and return to the ER or see a physician for new or worsening symptoms.  Thank you. Filed Vitals:   12/11/13 1452 12/11/13 1744 12/11/13 1834  BP: 151/105 120/100 157/103  Pulse: 81 69 63  Temp: 98.4 F (36.9 C) 98.5 F (36.9 C) 98.2 F (36.8 C)  TempSrc: Oral Oral Oral  Resp: 16 17 18   SpO2: 100% 99% 100%    Emergency Department Resource Guide 1) Find a Doctor and Pay Out of Pocket Although you won't have to find out who is covered by your insurance plan, it is a good idea to ask around and get recommendations. You will then need to call the office and see if the doctor you have chosen will accept you as a new patient and what types of options they offer for patients who are self-pay. Some doctors offer discounts or will set up payment plans for their patients who do not have insurance, but you will need to ask so you aren't surprised when you get to your appointment.  2) Contact Your Local Health Department Not all health departments have doctors that can see patients for sick visits, but many do, so it is worth a call to see if yours does. If you don't know where your local health department is, you can check in your phone book. The CDC also has a tool to help you locate your state's health department, and many state websites also have listings of all of their local health departments.  3) Find a [facility] If your illness is not likely to be very severe or complicated, you may want to try a walk in clinic. These are popping up all over the country in pharmacies, drugstores, and shopping centers. They're usually staffed by nurse practitioners or physician assistants that have been  trained to treat common illnesses and complaints. They're usually fairly quick and inexpensive. However, if you have serious medical issues or chronic medical problems, these are probably not your best option.  No Primary Care Doctor: - Call Health Connect at  [phone] - they can help you locate a primary care doctor that  accepts your insurance, provides certain services, etc. - Physician Referral Service- [phone]  Chronic Pain Problems: Organization         Address  Phone   Notes  [facility]  [phone] Patients need to be referred by their primary care doctor.   Medication Assistance: Organization         Address  Phone   Notes  [facility] [address] [place], [place] [address] [phone] --Must be a resident of [facility] -- Must have NO insurance coverage whatsoever (no Medicaid/ Medicare, etc.) -- The pt. MUST have a primary care doctor that directs their care regularly and follows them in the community   MedAssist  [phone]   [employer]  [phone]    Agencies that provide inexpensive medical care: Organization         Address  Phone   Notes  [place]  [phone]   [facility]    [phone]   Women's  [facility] [place], [place] [address] [phone]   [facility] [place] [address], [place] [phone]   Planned Parenthood    [phone]   [facility]    [phone]   [place] and [address], [place] Phone:  [phone], Fax:  [phone] Hours of Operation:  9 am - 6 pm, M-F.  Also accepts Medicaid/Medicare and self-pay.  [facility] for [place] [address], [address], [place] Phone: [phone], Fax: [phone]. Hours of Operation:  8:30 am - 5:30 pm, M-F.  Also accepts Medicaid and self-pay.   [facility] [address], [place] Phone: [phone]   [place], [place], [place] [phone], Ext. [phone] Mondays & Thursdays: 7-9 AM.  First 15 patients are seen on a first come, first serve basis.    [place] Providers:  Organization         Address  Phone   Notes  [facility] [address], [place] [phone] Also accepts self-pay patients.  [facility] [address], [place]  [phone]   [place], [address], [place] [phone]   [facility] [address], [place] [phone]   Lucianne Lei [address], [place]   [phone] Only accepts [place] Access [place] patients after they have their name applied to their card.   Self-Pay (no insurance) in [facility]:  Organization         Address  Phone   Notes  Sickle Cell Patients, [facility] [place] [phone]   [facility] [place] [phone]   [facility] [place]  [place], [place], [place] [phone]   [facility]/Dr. Osei-Bonsu  [address] [place] or [address], [place] [phone] Phone number for both [place] and [place] locations is the same.  Urgent Medical and [facility] [address] [place] [phone]   [facility] [address], [place] or [address] [phone]   [facility] [address], [place] [phone], phone; [phone], fax Sees patients 1st and 3rd Saturday of every month.  Must not qualify for public or private insurance (i.e. Medicaid, Medicare, [place] Health Choice, Veterans' Benefits)  Household income should be no  more than 200% of the poverty level The clinic cannot treat you if you are pregnant or think you are pregnant  Sexually transmitted diseases are not treated at the clinic.    Dental Care: Organization         Address  Phone  Notes  [facility] Department of [facility] [place] [phone] Accepts children up to age 13 who are enrolled in [place] or [place]; pregnant women with a Medicaid card; and children who have applied for Medicaid or  Health Choice, but were declined, whose parents can pay a reduced fee at time of service.  [facility] [place] Department of [facility]  [address], [place] [phone] Accepts children up to age 65 who are  enrolled in Medicaid or [place] Health Choice; pregnant women with a Medicaid card; and children who have applied for Medicaid or [place] Health Choice, but were declined, whose parents can pay a reduced fee at time of service.  [place] Adult Dental Access PROGRAM  [place] [phone] Patients are seen by appointment only. Walk-ins are not accepted. [place] will see patients 55 years of age and older. Monday - Tuesday (8am-5pm) Most Wednesdays (8:30-5pm) $30 per visit, cash only  [facility] Adult Dental Access PROGRAM  [address], [facility] [phone] Patients are seen by appointment only. Walk-ins are not accepted. [place] will see patients 58 years of age and older. One Wednesday Evening (Monthly: Volunteer Based).  $30 per visit, cash only  [place]  [phone] for adults; Children under age 72, call Graduate Pediatric Dentistry at [phone]. Children aged 89-14, please call [phone] to request a pediatric application.  Dental services are provided in all areas of dental care including fillings, crowns and bridges, complete and partial dentures, implants, gum treatment, root canals,  and extractions. Preventive care is also provided. Treatment is provided to both adults and children. Patients are selected via a lottery and there is often a waiting list.   [facility] [address] [place]  [phone] [email or web address]   [facility] [address] [place], [place] [phone], Ext. [phone] Second and Fourth Thursday of each month, opens at 6:30 AM; Clinic ends at 9 AM.  Patients are seen on a first-come first-served basis, and a limited number are seen during each clinic.   [facility]  [address] [place] Danker [place], [place] [phone]   Eligibility Requirements You must have lived in [place], [place], or [place] counties for at least the last three months.   You cannot be eligible for state or federal sponsored [employer], including [employer], [place], or [employer].   You generally cannot be eligible for healthcare insurance through your employer.    How to apply: Eligibility screenings are held every Tuesday and Wednesday afternoon from 1:00 pm until 4:00 pm. You do not need an appointment for the interview!  [facility] [address], [place], [place]   [place]  [place]  [place]  [phone]    Behavioral Health Resources in the Community: Intensive Outpatient Programs Organization         Address  Phone  Notes  [place] [place]. [address], [place], [place] [phone]   [facility] [address] [place], [place]   ADS: Alcohol & Drug S[facility] [address] [place], [place]   [place] [address],  [place], [place] or [phone]   Substance Abuse Resources Organization         Address  Phone  Notes  Alcohol and Drug Services  [phone]    [place]  [phone]   [facility] [place]   Chinita Pester  [phone]   Residential & Outpatient Substance Abuse Program  [phone]   Psychological Services Organization         Address  Phone  Notes  [facility]  [place]  [phone]   [place] [address]  [phone] or [phone]    Mobile Crisis Teams Organization         Address  Phone  Notes  [facility], Mobile Crisis Care Unit  [phone]   [facility]ive Psychotherapeutic Services  [address] [place], [place]   [facility] [address] [place] [phone]    Self-Help/Support Groups Organization         Address  Phone             Notes  [place]. of [place] - variety of support groups  [place] Call for more information  Narcotics Anonymous (NA), [facility] [address], [employer] [place]  2 meetings at this location   Special educational needs teacher         Address  Phone  Notes  ASAP Residential Treatment [place],    [place]  [phone]   [facility]  [address] [place] [phone], [place], [place]   [place], [place] [phone] Admissions: 8am-3pm M-F  Incentives Substance [place] [address]    [place], [place] [phone]   [facility] [address] [place], [place], [place]   [facility] [address]  [place], [place]   [facility] - Intensive Outpatient [address], [place], [place]   [facility] ([place].,  [place], [place] [phone] or [phone]   [facility] Treatment Services (RTS) [address] [place], [place] Accepts Medicaid  [facility] [place] [address]    [place] 1-2290647981 Substance Abuse/Addiction Treatment   [place] Organization         Address  Phone  Notes  [facility]  [phone]   Domenic Schwab, PhD [address] Arlis [place], [place]   [phone] or [phone]   [place], [place] [phone]   [facility] [address], [place], [place] [phone] Insurance/Medicaid/sponsorship through [facility] and Families [address] [place]                                    [place], [place] [phone] [place] [address], [place] [phone]    Dr. Adele Schilder  [phone]   [facility] Dept. 1) [address], [place] 2) [place] 3)  [place] [address], [place] [phone]  [phone]   [place] [phone] or [phone] (After Hours)

## 2013-12-11 NOTE — ED Notes (Signed)
Attempted to call report. Receiving nurse to call back for report. 

## 2013-12-11 NOTE — ED Notes (Signed)
Pt here for detox from alcohol and cocaine. Last use of both was a few hours ago. Used "a lot" of cocaine and had 35 beers over past few days, last drink 0930. No SI/HI.

## 2013-12-17 NOTE — ED Provider Notes (Signed)
Medical screening examination/treatment/procedure(s) were performed by non-physician practitioner and as supervising physician I was immediately available for consultation/collaboration.   EKG Interpretation   Date/Time:  Thursday December 11 2013 17:01:56 EDT Ventricular Rate:  60 PR Interval:  146 QRS Duration: 90 QT Interval:  408 QTC Calculation: 408 R Axis:   72 Text Interpretation:  Normal sinus rhythm Normal ECG Normal ECG Confirmed  by Maryan Rued  MD, Loree Fee ([id number]) on 12/11/2013 6:58:52 PM        Blanchie Dessert, MD 12/17/13 0122

## 2014-05-16 ENCOUNTER — Encounter (HOSPITAL_COMMUNITY): Payer: Self-pay | Admitting: *Deleted

## 2014-05-16 ENCOUNTER — Emergency Department (HOSPITAL_COMMUNITY)
Admission: EM | Admit: 2014-05-16 | Discharge: 2014-05-16 | Disposition: A | Discharge status: Home / Self Care | Payer: BLUE CROSS/BLUE SHIELD | Attending: Emergency Medicine | Admitting: Emergency Medicine

## 2014-05-16 DIAGNOSIS — Y9289 Other specified places as the place of occurrence of the external cause: Secondary | ICD-10-CM | POA: Diagnosis not present

## 2014-05-16 DIAGNOSIS — L234 Allergic contact dermatitis due to dyes: Secondary | ICD-10-CM | POA: Diagnosis not present

## 2014-05-16 DIAGNOSIS — X58XXXA Exposure to other specified factors, initial encounter: Secondary | ICD-10-CM | POA: Insufficient documentation

## 2014-05-16 DIAGNOSIS — Z720 Tobacco use: Secondary | ICD-10-CM | POA: Insufficient documentation

## 2014-05-16 DIAGNOSIS — Y9389 Activity, other specified: Secondary | ICD-10-CM | POA: Diagnosis not present

## 2014-05-16 DIAGNOSIS — T7840XA Allergy, unspecified, initial encounter: Secondary | ICD-10-CM | POA: Diagnosis present

## 2014-05-16 DIAGNOSIS — L0201 Cutaneous abscess of face: Secondary | ICD-10-CM

## 2014-05-16 DIAGNOSIS — Y998 Other external cause status: Secondary | ICD-10-CM | POA: Diagnosis not present

## 2014-05-16 DIAGNOSIS — Z8547 Personal history of malignant neoplasm of testis: Secondary | ICD-10-CM | POA: Diagnosis not present

## 2014-05-16 MED ORDER — HYDROCODONE-ACETAMINOPHEN 5-325 MG PO TABS: 1.0000 | ORAL_TABLET | ORAL | Status: DC | PRN

## 2014-05-16 MED ORDER — DOXYCYCLINE HYCLATE 100 MG PO CAPS: 100.0000 mg | ORAL_CAPSULE | Freq: Two times a day (BID) | ORAL | Status: DC

## 2014-05-16 MED ORDER — CEFTRIAXONE SODIUM 1 G IJ SOLR
1.0000 g | Freq: Once | INTRAMUSCULAR | Status: AC
  Administered 2014-05-16: 1 g via INTRAMUSCULAR
  Filled 2014-05-16: qty 10

## 2014-05-16 MED ORDER — LIDOCAINE HCL (PF) 1 % IJ SOLN
INTRAMUSCULAR | Status: AC
  Filled 2014-05-16: qty 5

## 2014-05-16 MED ORDER — LIDOCAINE-EPINEPHRINE (PF) 1 %-1:200000 IJ SOLN
10.0000 mL | Freq: Once | INTRAMUSCULAR | Status: AC
  Administered 2014-05-16: 10 mL
  Filled 2014-05-16: qty 10

## 2014-05-16 MED ORDER — DOXYCYCLINE HYCLATE 100 MG PO TABS
100.0000 mg | ORAL_TABLET | Freq: Once | ORAL | Status: AC
  Administered 2014-05-16: 100 mg via ORAL
  Filled 2014-05-16: qty 1

## 2014-05-16 NOTE — ED Notes (Signed)
Pt verbalized understanding of no driving and to use caution within 4 hours of taking pain meds due to meds cause drowsiness 

## 2014-05-16 NOTE — Discharge Instructions (Signed)
It appears that the dye you used caused an allergic reaction and now you have developed some infection in the hair follicles under the chin. We are treating you with antibiotics. You will need to follow up in 2 days for recheck to be sure the area is improving. Return sooner for any problems. Apply warm wet compresses to the area several times a day.

## 2014-05-16 NOTE — ED Notes (Signed)
Pt states he died his beard on 06-14-22 like he normally does. Pt states that he has a knot under the left side of his chin. Pt states it has been keeping him awake at night. Denies dental pain.

## 2014-05-16 NOTE — ED Provider Notes (Signed)
CSN: [id number]     Arrival date & time 05/16/14  1932 History   First MD Initiated Contact with Patient 05/16/14 2013     Chief Complaint  Patient presents with  . Allergic Reaction     (Consider location/radiation/quality/duration/timing/severity/associated sxs/prior Treatment) Patient is a 49 y.o. male presenting with allergic reaction. The history is provided by the patient.  Allergic Reaction Presenting symptoms: rash   Severity:  Moderate Context: cosmetics   Relieved by:  Nothing Ineffective treatments:  None tried  Patrick Zavala is a 49 y.o. male who presents to the ED with a rash under his chin under his beard. He states that he died his beard a week ago. This is the first time he has ever done it and developed a rash and knot under his chin. He has been unable to sleep due to the pain. He has used peroxide but just developed sores under the beard.   Past Medical History  Diagnosis Date  . Cancer     testicular   Past Surgical History  Procedure Laterality Date  . Achilles tendon repair    . Port-a-cath removal    . 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 No pertinent family history. History  Substance Use Topics  . Smoking status: Current Every Day Smoker -- 0.20 packs/day    Types: Cigarettes  . Smokeless tobacco: Not on file  . Alcohol Use: Yes     Comment: Binge     Review of Systems  Skin: Positive for rash.       Abscess under chin  all other systems negative    Allergies  Oxycodone-acetaminophen  Home Medications   Prior to Admission medications   Medication Sig Start Date End Date Taking? Authorizing Provider  doxycycline (VIBRAMYCIN) 100 MG capsule Take 1 capsule (100 mg total) by mouth 2 (two) times daily. 05/16/14   Hope Bunnie Pion, NP  HYDROcodone-acetaminophen (NORCO/VICODIN) 5-325 MG per tablet Take 1 tablet by mouth every 4 (four) hours as needed. 05/16/14   Hope Bunnie Pion, NP   BP 152/102 mmHg  Pulse 87   Temp(Src) 100.6 F (38.1 C) (Oral)  Resp 20  Ht 5\' 10"  (1.778 m)  Wt 165 lb (74.844 kg)  BMI 23.68 kg/m2  SpO2 100% Physical Exam  Constitutional: He is oriented to person, place, and time. He appears well-developed and well-nourished.  HENT:  Facial hair with crusting areas noted. Tender swollen area under chin.   Eyes: EOM are normal.  Neck: Normal range of motion. Neck supple.  Cardiovascular: Normal rate.   Pulmonary/Chest: Effort normal.  Abdominal: Soft. There is no tenderness.  Musculoskeletal: Normal range of motion.  Lymphadenopathy:    He has cervical adenopathy.  Neurological: He is alert and oriented to person, place, and time. No cranial nerve deficit.  Skin: Skin is warm and dry.  Psychiatric: He has a normal mood and affect. His behavior is normal.  Nursing note and vitals reviewed.   ED Course  Procedures (including critical care time)  Dr. Alvino Chapel in to assess the area with ultrasound  INCISION AND DRAINAGE Performed by: NEESE,HOPE Consent: Verbal consent obtained. Risks and benefits: risks, benefits and alternatives were discussed Type: abscess  Body area: chin  Anesthesia: local infiltration  Local anesthetic: lidocaine 1% without epinephrine  Anesthetic total: 2 ml  Incision: Straight with # 11 blade  Complexity: complex Blunt dissection to break up loculations  Drainage: purulent, bloody  Drainage  amount: small  Packing material: none  Patient tolerance: Patient tolerated the procedure well with no immediate complications.  Follow up in 2 days   MDM  49 y.o. male with allergic reaction face due to hair dye. Stable for d/c without fever or facial cellulitis. Will start antibiotics and he will return in 2 days for recheck. He will return sooner for any problems.   Final diagnoses:  Abscess of face      Ashley Murrain, NP 05/18/14 0105  Davonna Belling, MD 05/19/14 [phone]

## 2014-05-25 MED FILL — Hydrocodone-Acetaminophen Tab 5-325 MG: ORAL | Qty: 6 | Status: AC

## 2015-12-14 IMAGING — CR DG KNEE COMPLETE 4+V*L*
4 series · 4 of 4 positions shown · non-contrast
Comparison: None.

CLINICAL DATA: Lateral left knee pain

EXAM:
LEFT KNEE - COMPLETE 4+ VIEW

[view not recorded (1 of 4)]
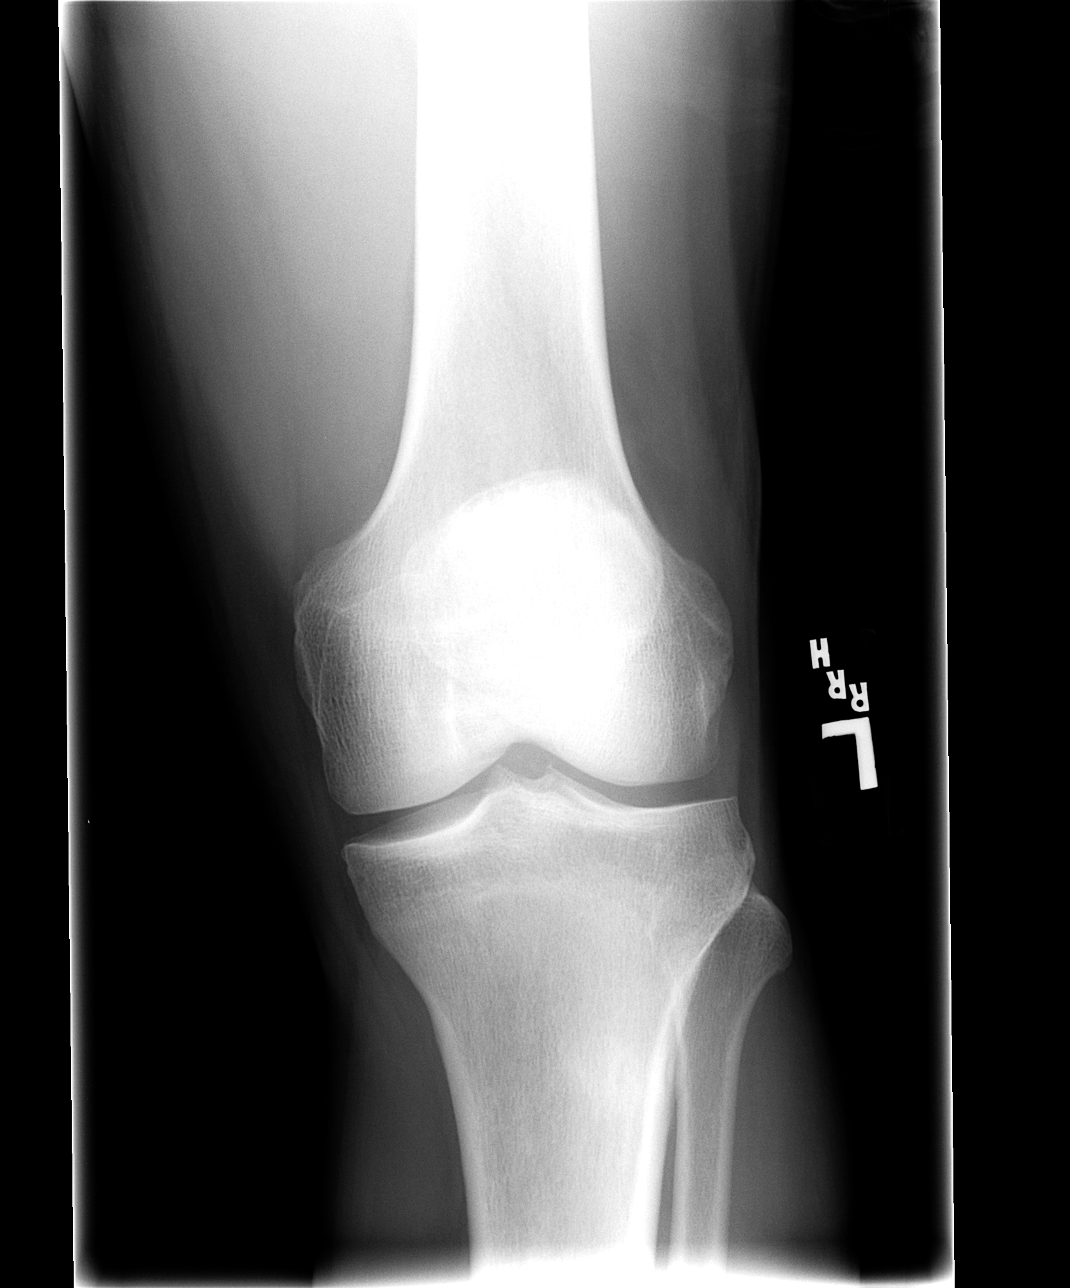

[view not recorded (2 of 4)]
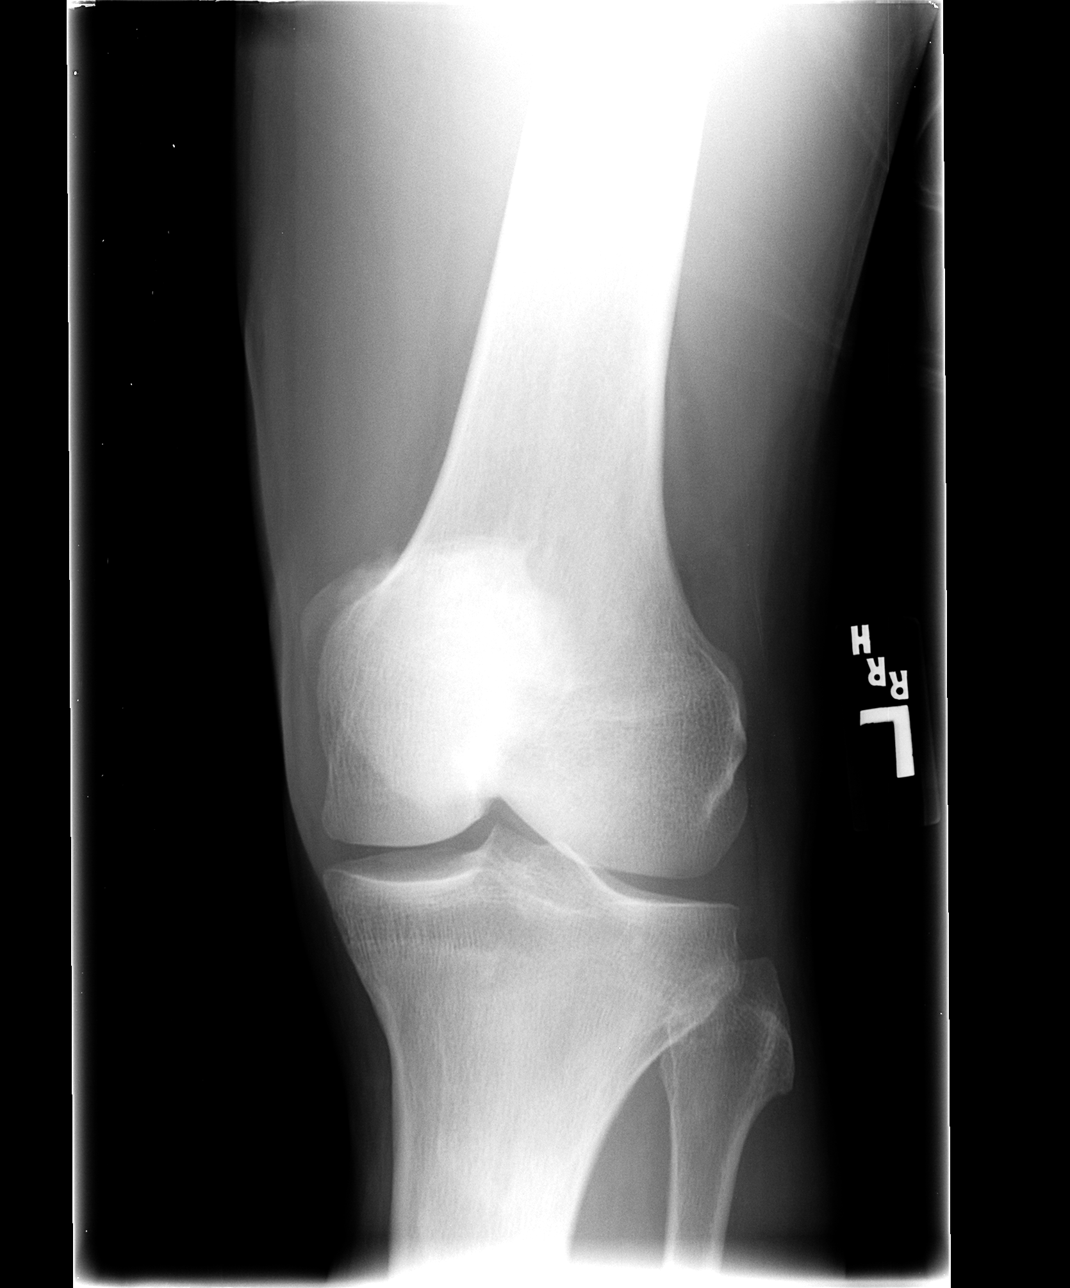

[view not recorded (3 of 4)]
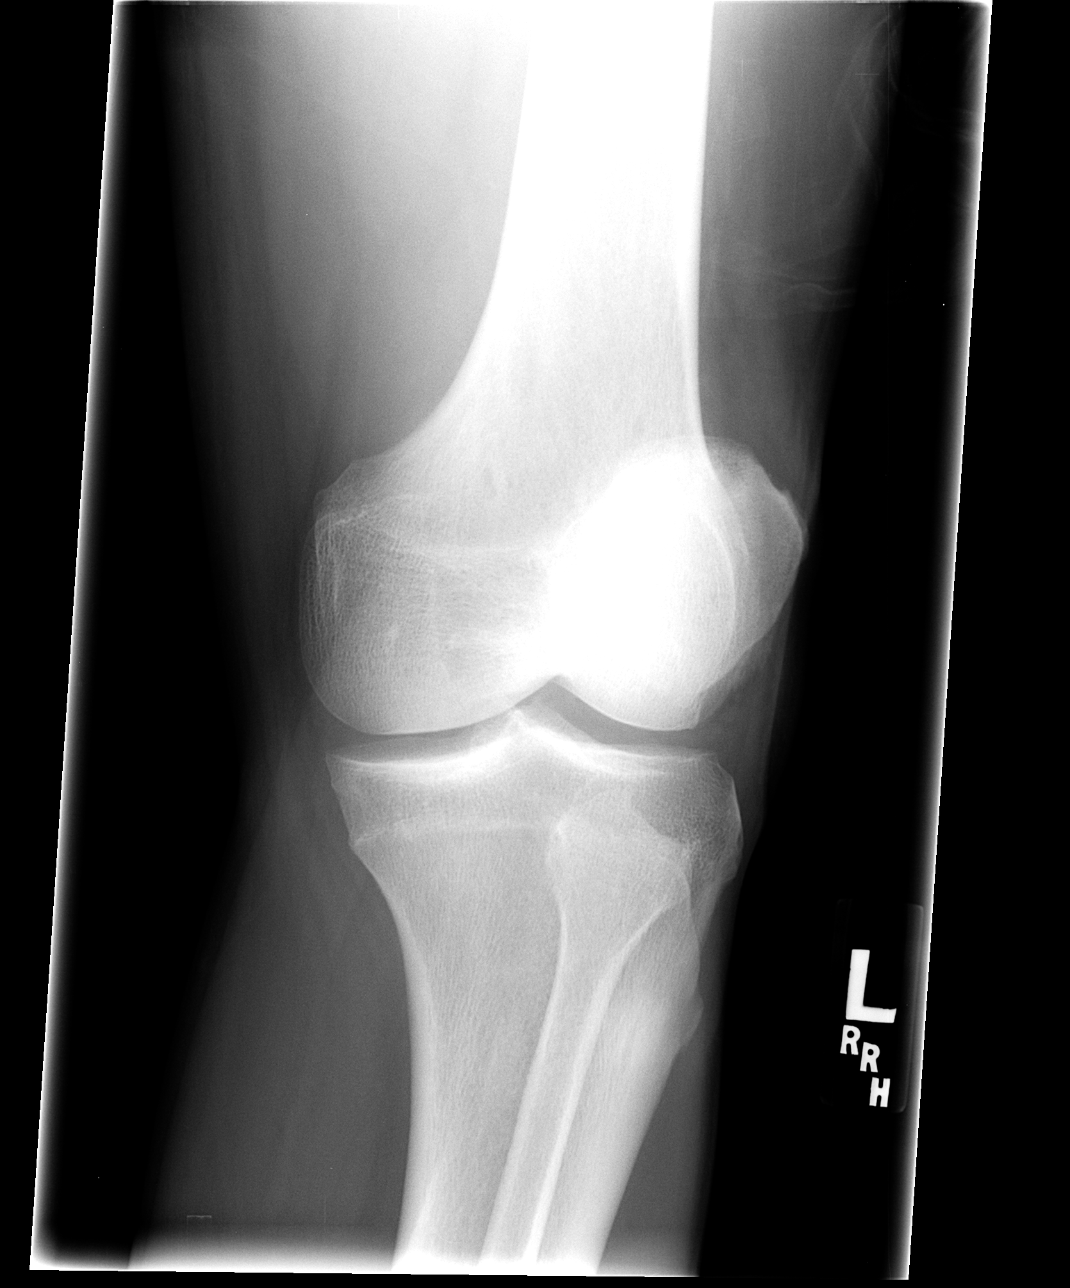

[view not recorded (4 of 4)]
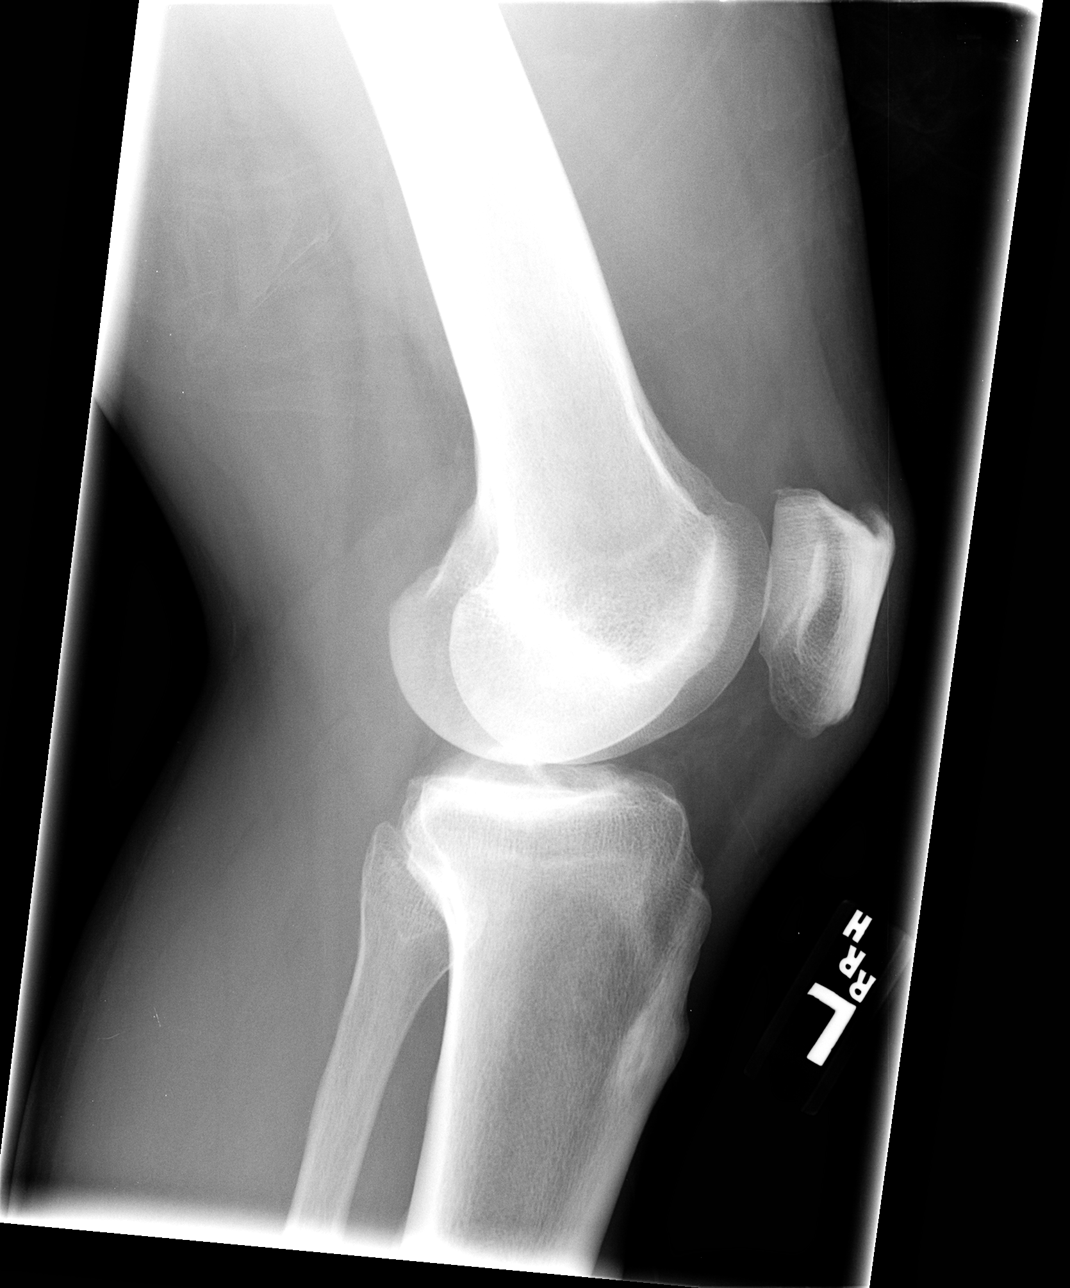

[4 of 4 positions shown; findings below may reference images not displayed]

FINDINGS: There is no evidence of fracture, dislocation, or joint effusion.
There is no evidence of arthropathy or other focal bone abnormality.
Soft tissues are unremarkable.
IMPRESSION: Negative.

## 2017-11-20 ENCOUNTER — Emergency Department (HOSPITAL_COMMUNITY)
Admission: EM | Admit: 2017-11-20 | Discharge: 2017-11-20 | Disposition: A | Discharge status: Home / Self Care | Payer: BLUE CROSS/BLUE SHIELD | Attending: Emergency Medicine | Admitting: Emergency Medicine

## 2017-11-20 ENCOUNTER — Other Ambulatory Visit: Payer: Self-pay

## 2017-11-20 ENCOUNTER — Encounter (HOSPITAL_COMMUNITY): Payer: Self-pay | Admitting: Emergency Medicine

## 2017-11-20 DIAGNOSIS — Z8547 Personal history of malignant neoplasm of testis: Secondary | ICD-10-CM | POA: Insufficient documentation

## 2017-11-20 DIAGNOSIS — G44209 Tension-type headache, unspecified, not intractable: Secondary | ICD-10-CM

## 2017-11-20 DIAGNOSIS — F17210 Nicotine dependence, cigarettes, uncomplicated: Secondary | ICD-10-CM | POA: Insufficient documentation

## 2017-11-20 MED ORDER — METOCLOPRAMIDE HCL 10 MG PO TABS
10.0000 mg | ORAL_TABLET | Freq: Once | ORAL | Status: AC
  Administered 2017-11-20: 10 mg via ORAL
  Filled 2017-11-20: qty 1

## 2017-11-20 MED ORDER — IBUPROFEN 800 MG PO TABS: 800.0000 mg | ORAL_TABLET | Freq: Three times a day (TID) | ORAL | 0 refills | Status: DC | PRN

## 2017-11-20 MED ORDER — KETOROLAC TROMETHAMINE 30 MG/ML IJ SOLN
30.0000 mg | Freq: Once | INTRAMUSCULAR | Status: AC
  Administered 2017-11-20: 30 mg via INTRAMUSCULAR
  Filled 2017-11-20: qty 1

## 2017-11-20 NOTE — ED Provider Notes (Signed)
Emergency Department Provider Note   I have reviewed the triage vital signs and the nursing notes.   HISTORY  Chief Complaint Headache   HPI Patrick Zavala is a 52 y.o. male with remote history of metastatic prostate cancer (treatment 25 years prior) presents to the emergency department for evaluation of frontal headache.  Patient has had 5 days of intermittent symptoms.  He describes it as a throbbing, frontal headache that kept him from working late last week.  States he has not missed work since 2003 so that was unusual for him.  He denies any sudden onset, maximal intensity headache symptoms.  Denies fevers or chills.  Vision changes or photophobia.  No history of similar headaches in the past.  He did take Motrin which improved his headache and allowed him to sleep but then noticed it had returned the next day.  Patient states he feels he can go back to work but wanted to come and get everything checked out and to get a note saying he can go back.  He denies any weakness or numbness. No radiation of symptoms or other modifying factors.   Past Medical History:  Diagnosis Date  . Cancer [facility])    testicular    Patient Active Problem List   Diagnosis Date Noted  . ACHILLES TENDON RUPTURE 06/13/2007    Past Surgical History:  Procedure Laterality Date  . ACHILLES TENDON REPAIR    . PORT-A-CATH REMOVAL    . TESTICLE REMOVAL     due to cancer, Right   Allergies Oxycodone-acetaminophen  No family history on file.  Social History Social History   Tobacco Use  . Smoking status: Current Every Day Smoker    Packs/day: 0.20    Types: Cigarettes  . Smokeless tobacco: Never Used  Substance Use Topics  . Alcohol use: Not Currently  . 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 ENT: No sore throat. Cardiovascular: Denies chest pain. Respiratory:  Denies shortness of breath. Gastrointestinal: No abdominal pain.  No nausea, no vomiting.  No diarrhea.  No constipation. Genitourinary: Negative for dysuria. Musculoskeletal: Negative for back pain. Skin: Negative for rash. Neurological: Negative for focal weakness or numbness. Positive frontal HA.   10-point ROS otherwise negative.  ____________________________________________   PHYSICAL EXAM:  VITAL SIGNS: ED Triage Vitals  Enc Vitals Group     BP 11/20/17 0957 (!) 165/99     Pulse Rate 11/20/17 0957 60     Resp 11/20/17 0957 18     Temp 11/20/17 0957 98.1 F (36.7 C)     Temp Source 11/20/17 0957 Oral     SpO2 11/20/17 0957 100 %     Weight 11/20/17 1000 168 lb (76.2 kg)     Height 11/20/17 1000 5\' 10"  (1.778 m)     Pain Score 11/20/17 0959 6   Constitutional: Alert and oriented. Well appearing and in no acute distress. Eyes: Conjunctivae are normal. PERRL. EOMI. Normal fundoscopic exam bilaterally.  Head: Atraumatic. Nose: No congestion/rhinnorhea. Mouth/Throat: Mucous membranes are moist. Neck: No stridor.  Cardiovascular: Normal rate, regular rhythm. Good peripheral circulation. Grossly normal heart sounds.   Respiratory: Normal respiratory effort.  No retractions. Lungs CTAB. Gastrointestinal: Soft and nontender. No distention.  Musculoskeletal: No lower extremity tenderness nor edema. No gross deformities of extremities. Neurologic:  Normal speech and language. No gross  focal neurologic deficits are appreciated. Normal CN exam 2-12.  Skin:  Skin is warm, dry and intact. No rash noted.  ____________________________________________  RADIOLOGY  None ____________________________________________   PROCEDURES  Procedure(s) performed:   Procedures  None ____________________________________________   INITIAL IMPRESSION / ASSESSMENT AND PLAN / ED COURSE  Pertinent labs & imaging results that were available during my care of the patient were reviewed by me  and considered in my medical decision making (see chart for details).  Patient presents to the emergency department for frontal headache for the past 5 days.  Symptoms improved with Motrin but then returned.  He has no focal neurological deficits.  No sudden onset, maximal intensity headache symptoms.  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  My suspicion for metastatic disease with this headache pattern and exam is extremely low.  I did discuss with the patient that we will treat his symptoms here and that clinically I suspect a tension headache.  Advised getting plenty of rest and drinking fluids.  I did tell him that if his headache continues he may need to be evaluated further by either his PCP or a neurologist who may consider neuroimaging at that time.   11:02 AM She is feeling much better after Toradol and Reglan.  Suspect tension headache as outlined above.  Discussed follow-up plan with PCP.  Will also provide contact information for local neurology if headache returns or worsens.  Also discussed ED return precautions in detail.   At this time, I do not feel there is any life-threatening condition present. I have reviewed and discussed all results (EKG, imaging, lab, urine as appropriate), exam findings with patient. I have reviewed nursing notes and appropriate previous records.  I feel the patient is safe to be discharged home without further emergent workup. Discussed usual and customary return precautions. Patient and family (if present) verbalize understanding and are comfortable with this plan.  Patient will follow-up with their primary care provider. If they do not have a primary care provider, information for follow-up has been provided to them. All questions have been answered.  ____________________________________________  FINAL CLINICAL IMPRESSION(S) / ED DIAGNOSES  Final diagnoses:  Acute non  intractable tension-type headache     MEDICATIONS GIVEN DURING THIS VISIT:  Medications  ketorolac (TORADOL) 30 MG/ML injection 30 mg (30 mg Intramuscular Given 11/20/17 1015)  metoCLOPramide (REGLAN) tablet 10 mg (10 mg Oral Given 11/20/17 1016)     NEW OUTPATIENT MEDICATIONS STARTED DURING THIS VISIT:  New Prescriptions   IBUPROFEN (ADVIL,MOTRIN) 800 MG TABLET    Take 1 tablet (800 mg total) by mouth every 8 (eight) hours as needed for headache.    Note:  This document was prepared using Dragon voice recognition software and may include unintentional dictation errors.  Nanda Quinton, MD Emergency Medicine    Moni Rothrock, [facility], MD 11/20/17 1102

## 2017-11-20 NOTE — Discharge Instructions (Signed)

## 2017-11-20 NOTE — ED Triage Notes (Signed)
Headache to forehead area since Thursday.  Pt states he took sinus medication with no relief.

## 2018-04-17 ENCOUNTER — Encounter (HOSPITAL_BASED_OUTPATIENT_CLINIC_OR_DEPARTMENT_OTHER): Payer: Self-pay | Admitting: Emergency Medicine

## 2018-04-17 ENCOUNTER — Emergency Department (HOSPITAL_BASED_OUTPATIENT_CLINIC_OR_DEPARTMENT_OTHER)
Admission: EM | Admit: 2018-04-17 | Discharge: 2018-04-18 | Disposition: A | Discharge status: Home / Self Care | Payer: Worker's Compensation | Attending: Emergency Medicine | Admitting: Emergency Medicine

## 2018-04-17 ENCOUNTER — Other Ambulatory Visit: Payer: Self-pay

## 2018-04-17 DIAGNOSIS — Y990 Civilian activity done for income or pay: Secondary | ICD-10-CM | POA: Diagnosis not present

## 2018-04-17 DIAGNOSIS — Y939 Activity, unspecified: Secondary | ICD-10-CM | POA: Diagnosis not present

## 2018-04-17 DIAGNOSIS — S51812A Laceration without foreign body of left forearm, initial encounter: Secondary | ICD-10-CM | POA: Insufficient documentation

## 2018-04-17 DIAGNOSIS — Z23 Encounter for immunization: Secondary | ICD-10-CM | POA: Diagnosis not present

## 2018-04-17 DIAGNOSIS — Z8547 Personal history of malignant neoplasm of testis: Secondary | ICD-10-CM | POA: Diagnosis not present

## 2018-04-17 DIAGNOSIS — F17210 Nicotine dependence, cigarettes, uncomplicated: Secondary | ICD-10-CM | POA: Diagnosis not present

## 2018-04-17 DIAGNOSIS — W268XXA Contact with other sharp object(s), not elsewhere classified, initial encounter: Secondary | ICD-10-CM | POA: Insufficient documentation

## 2018-04-17 DIAGNOSIS — Y929 Unspecified place or not applicable: Secondary | ICD-10-CM | POA: Diagnosis not present

## 2018-04-17 MED ORDER — LIDOCAINE-EPINEPHRINE (PF) 2 %-1:200000 IJ SOLN
INTRAMUSCULAR | Status: AC
  Filled 2018-04-17: qty 10

## 2018-04-17 NOTE — ED Triage Notes (Signed)
Pt c/o laceration on left forearm while at  Work.

## 2018-04-17 NOTE — ED Notes (Signed)
Wound cleaned with betadine and wound cleanser, wet to dry dressing applied.

## 2018-04-18 MED ORDER — TETANUS-DIPHTH-ACELL PERTUSSIS 5-2.5-18.5 LF-MCG/0.5 IM SUSP
0.5000 mL | Freq: Once | INTRAMUSCULAR | Status: AC
  Administered 2018-04-18: 0.5 mL via INTRAMUSCULAR
  Filled 2018-04-18: qty 0.5

## 2018-04-18 NOTE — ED Notes (Signed)
ED Provider at bedside. 

## 2018-04-18 NOTE — ED Provider Notes (Signed)
[facility] EMERGENCY DEPARTMENT Provider Note   CSN: [id number] Arrival date & time: 04/17/18  2301    History   Chief Complaint Chief Complaint  Patient presents with  . Laceration    HPI Patrick Zavala is a 53 y.o. male.    Patient gives permission to perform history and physical in front of family/friend The history is provided by the patient and a friend.  Laceration  Location:  Shoulder/arm Shoulder/arm laceration location:  L forearm Depth:  Cutaneous Pain details:    Quality:  Aching   Severity:  Mild   Timing:  Constant Foreign body present:  No foreign bodies Relieved by:  Nothing Worsened by:  Nothing Tetanus status:  Unknown Associated symptoms: no swelling    Patient sustained an accidental laceration to his left forearm at work.  This occurred just prior to arrival He reports he was cut on the left forearm by yarn accidentally Past Medical History:  Diagnosis Date  . Cancer [facility])    testicular    Patient Active Problem List   Diagnosis Date Noted  . ACHILLES TENDON RUPTURE 06/13/2007    Past Surgical History:  Procedure Laterality Date  . ACHILLES TENDON REPAIR    . PORT-A-CATH REMOVAL    . TESTICLE REMOVAL     due to cancer, Right        Home Medications    Prior to Admission medications   Not on File    Family History No family history on file.  Social History Social History   Tobacco Use  . Smoking status: Current Every Day Smoker    Packs/day: 0.20    Types: Cigarettes  . Smokeless tobacco: Never Used  Substance Use Topics  . Alcohol use: Not Currently  . Drug use: Yes    Types: Cocaine    Comment: states he quit cocaine last year 2015     Allergies   Oxycodone-acetaminophen   Review of Systems Review of Systems  Skin: Positive for wound.  Neurological: Negative for weakness.     Physical Exam Updated Vital Signs BP (!) 161/96 (BP Location: Left Arm)   Pulse (!) 57   Temp 98 F (36.7 C)  (Oral)   Resp 18   Ht 1.778 m (5\' 10" )   Wt 73.5 kg   SpO2 100%   BMI 23.24 kg/m   Physical Exam CONSTITUTIONAL: Well developed/well nourished HEAD: Normocephalic/atraumatic EYES: EOMI ENMT: Mucous membranes moist NECK: supple no meningeal signs LUNGS:  no apparent distress ABDOMEN: soft NEURO: Pt is awake/alert/appropriate, moves all extremitiesx4.  No facial droop.   EXTREMITIES: pulses normal/equal, full ROM, see photo Full flexion extension of left wrist and all fingers without any difficulty, patient has appropriate power noted in his left arm and hand SKIN: warm, color normal, see photo  PSYCH: no abnormalities of mood noted, alert and oriented to situation    Patient gave verbal permission to utilize photo for medical documentation only The image was not stored on any personal device  ED Treatments / Results  Labs (all labs ordered are listed, but only abnormal results are displayed) Labs Reviewed - No data to display  EKG None  Radiology No results found.  Procedures .Marland KitchenLaceration Repair Date/Time: 04/18/2018 12:23 AM Performed by: Ripley Fraise, MD Authorized by: Ripley Fraise, MD   Consent:    Consent obtained:  Verbal   Consent given by:  Patient Anesthesia (see MAR for exact dosages):    Anesthesia method:  Local infiltration  Local anesthetic:  Lidocaine 1% WITH epi Laceration details:    Location:  Shoulder/arm   Shoulder/arm location:  L lower arm   Length (cm):  2 Exploration:    Wound exploration: wound explored through full range of motion     Contaminated: no   Treatment:    Area cleansed with:  Shur-Clens   Amount of cleaning:  Standard Skin repair:    Repair method:  Sutures   Suture size:  4-0   Suture material:  Prolene   Suture technique:  Simple interrupted   Number of sutures:  4 Approximation:    Approximation:  Close Post-procedure details:    Patient tolerance of procedure:  Tolerated well, no immediate  complications     Medications Ordered in ED Medications  lidocaine-EPINEPHrine (XYLOCAINE W/EPI) 2 %-1:200000 (PF) injection (has no administration in time range)     Initial Impression / Assessment and Plan / ED Course  I have reviewed the triage vital signs and the nursing notes.         This was a very clean wound, without any foreign bodies.  No active bleeding.  No injuries to any deep structures Patient insists on going back to work.  Final Clinical Impressions(s) / ED Diagnoses   Final diagnoses:  Laceration of left forearm, initial encounter    ED Discharge Orders    None       Ripley Fraise, MD 04/18/18 [phone]

## 2018-10-02 ENCOUNTER — Ambulatory Visit: Payer: Self-pay

## 2018-10-02 ENCOUNTER — Other Ambulatory Visit: Payer: Self-pay

## 2018-10-02 DIAGNOSIS — Z20822 Contact with and (suspected) exposure to covid-19: Secondary | ICD-10-CM

## 2018-10-02 NOTE — Telephone Encounter (Signed)
Pt. Having cough and body aches since Monday. Concerned about COVID 19. Has not seen his PCP in "years". Given information on testing sites. Offered to help pt. Establish with a PCP- refuses at this time.Instructed if he develops difficulty breathing to go to ED. Verbalizes understanding.   Answer Assessment - Initial Assessment Questions 1. COVID-19 DIAGNOSIS: "Who made your Coronavirus (COVID-19) diagnosis?" "Was it confirmed by a positive lab test?" If not diagnosed by a HCP, ask "Are there lots of cases (community spread) where you live?" (See public health department website, if unsure)     No test 2. ONSET: "When did the COVID-19 symptoms start?"      This week 3. WORST SYMPTOM: "What is your worst symptom?" (e.g., cough, fever, shortness of breath, muscle aches)     Cough and body aches 4. COUGH: "Do you have a cough?" If so, ask: "How bad is the cough?"       Moderate  5. FEVER: "Do you have a fever?" If so, ask: "What is your temperature, how was it measured, and when did it start?"     No 6. RESPIRATORY STATUS: "Describe your breathing?" (e.g., shortness of breath, wheezing, unable to speak)      No 7. BETTER-SAME-WORSE: "Are you getting better, staying the same or getting worse compared to yesterday?"  If getting worse, ask, "In what way?"     Worse 8. HIGH RISK DISEASE: "Do you have any chronic medical problems?" (e.g., asthma, heart or lung disease, weak immune system, etc.)     No 9. PREGNANCY: "Is there any chance you are pregnant?" "When was your last menstrual period?"     n/a 10. OTHER SYMPTOMS: "Do you have any other symptoms?"  (e.g., chills, fatigue, headache, loss of smell or taste, muscle pain, sore throat)       No  Protocols used: CORONAVIRUS (COVID-19) DIAGNOSED OR SUSPECTED-A-AH

## 2018-10-03 LAB — NOVEL CORONAVIRUS, NAA: SARS-CoV-2, NAA: NOT DETECTED
